# Patient Record
Sex: Female | Born: 1970 | Race: Black or African American | Hispanic: No | Marital: Married | Smoking: Former smoker
Health system: Southern US, Community
[De-identification: ages and names within clinical notes are randomized; demographics above are authoritative.]

## PROBLEM LIST (undated history)

## (undated) DIAGNOSIS — F4310 Post-traumatic stress disorder, unspecified: Secondary | ICD-10-CM

## (undated) DIAGNOSIS — J343 Hypertrophy of nasal turbinates: Secondary | ICD-10-CM

## (undated) DIAGNOSIS — G43909 Migraine, unspecified, not intractable, without status migrainosus: Secondary | ICD-10-CM

## (undated) DIAGNOSIS — Z8489 Family history of other specified conditions: Secondary | ICD-10-CM

## (undated) DIAGNOSIS — R51 Headache: Secondary | ICD-10-CM

## (undated) DIAGNOSIS — R519 Headache, unspecified: Secondary | ICD-10-CM

## (undated) DIAGNOSIS — M797 Fibromyalgia: Secondary | ICD-10-CM

## (undated) DIAGNOSIS — J45909 Unspecified asthma, uncomplicated: Secondary | ICD-10-CM

## (undated) HISTORY — PX: CYST EXCISION: SHX5701

## (undated) HISTORY — PX: MOUTH SURGERY: SHX715

---

## 2010-10-30 HISTORY — PX: ABDOMINAL HYSTERECTOMY: SHX81

## 2015-11-07 ENCOUNTER — Ambulatory Visit (INDEPENDENT_AMBULATORY_CARE_PROVIDER_SITE_OTHER): Payer: PRIVATE HEALTH INSURANCE | Admitting: Otolaryngology

## 2015-11-07 DIAGNOSIS — J320 Chronic maxillary sinusitis: Secondary | ICD-10-CM | POA: Diagnosis not present

## 2015-11-07 DIAGNOSIS — J342 Deviated nasal septum: Secondary | ICD-10-CM | POA: Diagnosis not present

## 2015-11-07 DIAGNOSIS — J343 Hypertrophy of nasal turbinates: Secondary | ICD-10-CM | POA: Diagnosis not present

## 2015-11-07 DIAGNOSIS — J310 Chronic rhinitis: Secondary | ICD-10-CM | POA: Diagnosis not present

## 2015-11-09 ENCOUNTER — Other Ambulatory Visit (INDEPENDENT_AMBULATORY_CARE_PROVIDER_SITE_OTHER): Payer: Self-pay | Admitting: Otolaryngology

## 2015-11-09 DIAGNOSIS — J329 Chronic sinusitis, unspecified: Secondary | ICD-10-CM

## 2015-11-21 ENCOUNTER — Ambulatory Visit (HOSPITAL_COMMUNITY)
Admission: RE | Admit: 2015-11-21 | Discharge: 2015-11-21 | Disposition: A | Discharge status: Home / Self Care | Payer: PRIVATE HEALTH INSURANCE | Source: Ambulatory Visit | Attending: Otolaryngology | Admitting: Otolaryngology

## 2015-11-21 DIAGNOSIS — J0101 Acute recurrent maxillary sinusitis: Secondary | ICD-10-CM | POA: Insufficient documentation

## 2015-11-21 DIAGNOSIS — J329 Chronic sinusitis, unspecified: Secondary | ICD-10-CM

## 2015-11-22 DIAGNOSIS — J343 Hypertrophy of nasal turbinates: Secondary | ICD-10-CM

## 2015-11-22 HISTORY — DX: Hypertrophy of nasal turbinates: J34.3

## 2015-11-28 ENCOUNTER — Ambulatory Visit (INDEPENDENT_AMBULATORY_CARE_PROVIDER_SITE_OTHER): Payer: PRIVATE HEALTH INSURANCE | Admitting: Otolaryngology

## 2015-11-28 DIAGNOSIS — J343 Hypertrophy of nasal turbinates: Secondary | ICD-10-CM | POA: Diagnosis not present

## 2015-11-28 DIAGNOSIS — J310 Chronic rhinitis: Secondary | ICD-10-CM

## 2015-11-29 ENCOUNTER — Other Ambulatory Visit: Payer: Self-pay | Admitting: Otolaryngology

## 2015-12-15 ENCOUNTER — Encounter (HOSPITAL_BASED_OUTPATIENT_CLINIC_OR_DEPARTMENT_OTHER): Payer: Self-pay | Admitting: *Deleted

## 2015-12-20 ENCOUNTER — Ambulatory Visit (HOSPITAL_BASED_OUTPATIENT_CLINIC_OR_DEPARTMENT_OTHER): Payer: PRIVATE HEALTH INSURANCE | Admitting: Anesthesiology

## 2015-12-20 ENCOUNTER — Encounter (HOSPITAL_BASED_OUTPATIENT_CLINIC_OR_DEPARTMENT_OTHER)
Admission: RE | Disposition: A | Discharge status: Home / Self Care | Payer: Self-pay | Source: Ambulatory Visit | Attending: Otolaryngology

## 2015-12-20 ENCOUNTER — Ambulatory Visit (HOSPITAL_BASED_OUTPATIENT_CLINIC_OR_DEPARTMENT_OTHER)
Admission: RE | Admit: 2015-12-20 | Discharge: 2015-12-20 | Disposition: A | Discharge status: Home / Self Care | Payer: PRIVATE HEALTH INSURANCE | Source: Ambulatory Visit | Attending: Otolaryngology | Admitting: Otolaryngology

## 2015-12-20 ENCOUNTER — Encounter (HOSPITAL_BASED_OUTPATIENT_CLINIC_OR_DEPARTMENT_OTHER): Payer: Self-pay

## 2015-12-20 DIAGNOSIS — J3489 Other specified disorders of nose and nasal sinuses: Secondary | ICD-10-CM | POA: Insufficient documentation

## 2015-12-20 DIAGNOSIS — J343 Hypertrophy of nasal turbinates: Secondary | ICD-10-CM | POA: Insufficient documentation

## 2015-12-20 DIAGNOSIS — J45909 Unspecified asthma, uncomplicated: Secondary | ICD-10-CM | POA: Diagnosis not present

## 2015-12-20 DIAGNOSIS — Z79899 Other long term (current) drug therapy: Secondary | ICD-10-CM | POA: Diagnosis not present

## 2015-12-20 HISTORY — DX: Headache: R51

## 2015-12-20 HISTORY — DX: Hypertrophy of nasal turbinates: J34.3

## 2015-12-20 HISTORY — DX: Headache, unspecified: R51.9

## 2015-12-20 HISTORY — PX: TURBINATE REDUCTION: SHX6157

## 2015-12-20 HISTORY — DX: Family history of other specified conditions: Z84.89

## 2015-12-20 HISTORY — DX: Fibromyalgia: M79.7

## 2015-12-20 HISTORY — DX: Migraine, unspecified, not intractable, without status migrainosus: G43.909

## 2015-12-20 SURGERY — REDUCTION, NASAL TURBINATE
Anesthesia: General | Site: Nose | Laterality: Bilateral

## 2015-12-20 MED ORDER — DEXAMETHASONE SODIUM PHOSPHATE 10 MG/ML IJ SOLN
INTRAMUSCULAR | Status: AC
  Filled 2015-12-20: qty 1

## 2015-12-20 MED ORDER — SUCCINYLCHOLINE CHLORIDE 200 MG/10ML IV SOSY
PREFILLED_SYRINGE | INTRAVENOUS | Status: AC
  Filled 2015-12-20: qty 10

## 2015-12-20 MED ORDER — LACTATED RINGERS IV SOLN
INTRAVENOUS | Status: DC
  Administered 2015-12-20 (×2): via INTRAVENOUS

## 2015-12-20 MED ORDER — GLYCOPYRROLATE 0.2 MG/ML IJ SOLN: 0.2000 mg | Freq: Once | INTRAMUSCULAR | Status: DC | PRN

## 2015-12-20 MED ORDER — FENTANYL CITRATE (PF) 100 MCG/2ML IJ SOLN
50.0000 ug | INTRAMUSCULAR | Status: DC | PRN
  Administered 2015-12-20: 100 ug via INTRAVENOUS

## 2015-12-20 MED ORDER — MIDAZOLAM HCL 2 MG/2ML IJ SOLN
1.0000 mg | INTRAMUSCULAR | Status: DC | PRN
  Administered 2015-12-20: 2 mg via INTRAVENOUS

## 2015-12-20 MED ORDER — HYDROMORPHONE HCL 1 MG/ML IJ SOLN
0.2500 mg | INTRAMUSCULAR | Status: DC | PRN
  Administered 2015-12-20 (×2): 0.5 mg via INTRAVENOUS

## 2015-12-20 MED ORDER — SUCCINYLCHOLINE CHLORIDE 20 MG/ML IJ SOLN
INTRAMUSCULAR | Status: DC | PRN
  Administered 2015-12-20: 120 mg via INTRAVENOUS

## 2015-12-20 MED ORDER — ONDANSETRON HCL 4 MG/2ML IJ SOLN: 4.0000 mg | Freq: Once | INTRAMUSCULAR | Status: DC | PRN

## 2015-12-20 MED ORDER — DEXAMETHASONE SODIUM PHOSPHATE 4 MG/ML IJ SOLN
INTRAMUSCULAR | Status: DC | PRN
  Administered 2015-12-20: 10 mg via INTRAVENOUS

## 2015-12-20 MED ORDER — OXYCODONE HCL 5 MG/5ML PO SOLN: 5.0000 mg | Freq: Once | ORAL | Status: DC | PRN

## 2015-12-20 MED ORDER — FENTANYL CITRATE (PF) 100 MCG/2ML IJ SOLN
INTRAMUSCULAR | Status: AC
  Filled 2015-12-20: qty 2

## 2015-12-20 MED ORDER — OXYMETAZOLINE HCL 0.05 % NA SOLN
NASAL | Status: DC | PRN
  Administered 2015-12-20: 1

## 2015-12-20 MED ORDER — HYDROMORPHONE HCL 1 MG/ML IJ SOLN
INTRAMUSCULAR | Status: AC
  Filled 2015-12-20: qty 1

## 2015-12-20 MED ORDER — OXYCODONE HCL 5 MG PO TABS: 5.0000 mg | ORAL_TABLET | Freq: Once | ORAL | Status: DC | PRN

## 2015-12-20 MED ORDER — OXYMETAZOLINE HCL 0.05 % NA SOLN
NASAL | Status: AC
  Filled 2015-12-20: qty 15

## 2015-12-20 MED ORDER — PHENYLEPHRINE 40 MCG/ML (10ML) SYRINGE FOR IV PUSH (FOR BLOOD PRESSURE SUPPORT)
PREFILLED_SYRINGE | INTRAVENOUS | Status: AC
  Filled 2015-12-20: qty 10

## 2015-12-20 MED ORDER — SCOPOLAMINE 1 MG/3DAYS TD PT72: 1.0000 | MEDICATED_PATCH | Freq: Once | TRANSDERMAL | Status: DC | PRN

## 2015-12-20 MED ORDER — ONDANSETRON HCL 4 MG/2ML IJ SOLN
INTRAMUSCULAR | Status: AC
  Filled 2015-12-20: qty 2

## 2015-12-20 MED ORDER — OXYCODONE-ACETAMINOPHEN 5-325 MG PO TABS: 1.0000 | ORAL_TABLET | Freq: Four times a day (QID) | ORAL | 0 refills | Status: DC | PRN

## 2015-12-20 MED ORDER — MEPERIDINE HCL 25 MG/ML IJ SOLN: 6.2500 mg | INTRAMUSCULAR | Status: DC | PRN

## 2015-12-20 MED ORDER — ONDANSETRON HCL 4 MG/2ML IJ SOLN
INTRAMUSCULAR | Status: DC | PRN
  Administered 2015-12-20: 4 mg via INTRAVENOUS

## 2015-12-20 MED ORDER — LIDOCAINE 2% (20 MG/ML) 5 ML SYRINGE
INTRAMUSCULAR | Status: AC
  Filled 2015-12-20: qty 5

## 2015-12-20 MED ORDER — PROPOFOL 10 MG/ML IV BOLUS
INTRAVENOUS | Status: DC | PRN
  Administered 2015-12-20: 200 mg via INTRAVENOUS

## 2015-12-20 MED ORDER — AMOXICILLIN 875 MG PO TABS: 875.0000 mg | ORAL_TABLET | Freq: Two times a day (BID) | ORAL | 0 refills | Status: DC

## 2015-12-20 MED ORDER — LIDOCAINE HCL (CARDIAC) 20 MG/ML IV SOLN
INTRAVENOUS | Status: DC | PRN
  Administered 2015-12-20: 100 mg via INTRAVENOUS

## 2015-12-20 MED ORDER — MIDAZOLAM HCL 2 MG/2ML IJ SOLN
INTRAMUSCULAR | Status: AC
  Filled 2015-12-20: qty 2

## 2015-12-20 SURGICAL SUPPLY — 22 items
ATTRACTOMAT 16X20 MAGNETIC DRP (DRAPES) IMPLANT
CANISTER SUCT 1200ML W/VALVE (MISCELLANEOUS) ×3 IMPLANT
COAGULATOR SUCT 8FR VV (MISCELLANEOUS) IMPLANT
DECANTER SPIKE VIAL GLASS SM (MISCELLANEOUS) IMPLANT
ELECT REM PT RETURN 9FT ADLT (ELECTROSURGICAL) ×3
ELECTRODE REM PT RTRN 9FT ADLT (ELECTROSURGICAL) ×1 IMPLANT
GLOVE BIO SURGEON STRL SZ7.5 (GLOVE) ×3 IMPLANT
GOWN STRL REUS W/ TWL LRG LVL3 (GOWN DISPOSABLE) ×2 IMPLANT
GOWN STRL REUS W/TWL LRG LVL3 (GOWN DISPOSABLE) ×4
NEEDLE HYPO 25X1 1.5 SAFETY (NEEDLE) ×3 IMPLANT
NS IRRIG 1000ML POUR BTL (IV SOLUTION) ×3 IMPLANT
PACK BASIN DAY SURGERY FS (CUSTOM PROCEDURE TRAY) ×3 IMPLANT
PACK ENT DAY SURGERY (CUSTOM PROCEDURE TRAY) ×3 IMPLANT
PACKING NASAL EPIS 4X2.4 XEROG (MISCELLANEOUS) IMPLANT
PATTIES SURGICAL .5 X3 (DISPOSABLE) IMPLANT
SLEEVE SCD COMPRESS KNEE MED (MISCELLANEOUS) IMPLANT
SOLUTION BUTLER CLEAR DIP (MISCELLANEOUS) ×3 IMPLANT
SPONGE GAUZE 2X2 8PLY STER LF (GAUZE/BANDAGES/DRESSINGS) ×1
SPONGE GAUZE 2X2 8PLY STRL LF (GAUZE/BANDAGES/DRESSINGS) ×2 IMPLANT
SPONGE NEURO XRAY DETECT 1X3 (DISPOSABLE) ×3 IMPLANT
TOWEL OR 17X24 6PK STRL BLUE (TOWEL DISPOSABLE) ×3 IMPLANT
YANKAUER SUCT BULB TIP NO VENT (SUCTIONS) IMPLANT

## 2015-12-20 NOTE — Anesthesia Preprocedure Evaluation (Signed)
Anesthesia Evaluation  Patient identified by MRN, date of birth, ID band Patient awake    Reviewed: Allergy & Precautions, NPO status , Patient's Chart, lab work & pertinent test results  Airway Mallampati: I  TM Distance: >3 FB Neck ROM: Full    Dental  (+) Teeth Intact, Dental Advisory Given   Pulmonary asthma ,    breath sounds clear to auscultation       Cardiovascular  Rhythm:Regular Rate:Normal     Neuro/Psych    GI/Hepatic   Endo/Other    Renal/GU      Musculoskeletal   Abdominal   Peds  Hematology   Anesthesia Other Findings   Reproductive/Obstetrics                             Anesthesia Physical Anesthesia Plan  ASA: I  Anesthesia Plan: General   Post-op Pain Management:    Induction: Intravenous  Airway Management Planned: Oral ETT  Additional Equipment:   Intra-op Plan:   Post-operative Plan: Extubation in OR  Informed Consent: I have reviewed the patients History and Physical, chart, labs and discussed the procedure including the risks, benefits and alternatives for the proposed anesthesia with the patient or authorized representative who has indicated his/her understanding and acceptance.   Dental advisory given  Plan Discussed with: CRNA and Anesthesiologist  Anesthesia Plan Comments:         Anesthesia Quick Evaluation

## 2015-12-20 NOTE — Anesthesia Postprocedure Evaluation (Signed)
Anesthesia Post Note  Patient: Diane Cowan  Procedure(s) Performed: Procedure(s) (LRB): TURBINATE REDUCTION (Bilateral)  Patient location during evaluation: PACU Anesthesia Type: General Level of consciousness: awake and alert Pain management: pain level controlled Vital Signs Assessment: post-procedure vital signs reviewed and stable Respiratory status: spontaneous breathing, nonlabored ventilation and respiratory function stable Cardiovascular status: blood pressure returned to baseline and stable Postop Assessment: no signs of nausea or vomiting Anesthetic complications: no    Last Vitals:  Vitals:   12/20/15 1220 12/20/15 1258  BP:  (!) 138/92  Pulse: 87 76  Resp: 16 18  Temp:  36.9 C    Last Pain:  Vitals:   12/20/15 1258  TempSrc:   PainSc: 3                  Diane Cowan

## 2015-12-20 NOTE — Discharge Instructions (Addendum)
POSTOPERATIVE INSTRUCTIONS FOR PATIENTS HAVING NASAL OR SINUS OPERATIONS ACTIVITY: Restrict activity at home for the first two days, resting as much as possible. Light activity is best. You may usually return to work within a week. You should refrain from nose blowing, strenuous activity, or heavy lifting greater than 20lbs for a total of three weeks after your operation.  If sneezing cannot be avoided, sneeze with your mouth open. DISCOMFORT: You may experience a dull headache and pressure along with nasal congestion and discharge. These symptoms may be worse during the first week after the operation but may last as long as two to four weeks.  Please take Tylenol or the pain medication that has been prescribed for you. Do not take aspirin or aspirin containing medications since they may cause bleeding.  You may experience symptoms of post nasal drainage, nasal congestion, headaches and fatigue for two or three months after your operation.  BLEEDING: You may have some blood tinged nasal drainage for approximately two weeks after the operation.  The discharge will be worse for the first week.  Please call our office at [phone] or go to the nearest hospital emergency room if you experience any of the following: heavy, bright red blood from your nose or mouth that lasts longer than ten minutes or coughing up or vomiting bright red blood or blood clots. GENERAL CONSIDERATIONS: 1. A gauze dressing will be placed on your upper lip to absorb any drainage after the operation. You may need to change this several times a day.  If you do not have very much drainage, you may remove the dressing.  Remember that you may gently wipe your nose with a tissue and sniff in, but DO NOT blow your nose. 2. Please keep all of your postoperative appointments.  Your final results after the operation will depend on proper follow-up.  The initial visit is usually four to seven days after the operation.  During this visit, the  remaining nasal packing and internal septal splints will be removed.  Your nasal and sinus cavities will be cleaned.  During the second visit, your nasal and sinus cavities will be cleaned again. Have someone drive you to your first two postoperative appointments. We suggest that you take your prescribed pain medication about  hour prior to each of these two appointments.  3. How you care for your nose after the operation will influence the results that you obtain.  You should follow all directions, take your medication as prescribed, and call our office [phone] with any problems or questions. 4. You may be more comfortable sleeping with your head elevated on two pillows. 5. Do not take any medications that we have not prescribed or recommended. WARNING SIGNS: if any of the following should occur, please call our office: 1. Bright red bleeding which lasts more than 10 minutes. 2. Persistent fever greater than 102F.   Post Anesthesia Home Care Instructions  Activity: Get plenty of rest for the remainder of the day. A responsible adult should stay with you for 24 hours following the procedure.  For the next 24 hours, DO NOT: -Drive a car -Advertising copywriterperate machinery -Drink alcoholic beverages -Take any medication unless instructed by your physician -Make any legal decisions or sign important papers.  Meals: Start with liquid foods such as gelatin or soup. Progress to regular foods as tolerated. Avoid greasy, spicy, heavy foods. If nausea and/or vomiting occur, drink only clear liquids until the nausea and/or vomiting subsides. Call your physician if vomiting continues.  Special Instructions/Symptoms: Your throat may feel dry or sore from the anesthesia or the breathing tube placed in your throat during surgery. If this causes discomfort, gargle with warm salt water. The discomfort should disappear within 24 hours.  If you had a scopolamine patch placed behind your ear for the management of post-  operative nausea and/or vomiting:  1. The medication in the patch is effective for 72 hours, after which it should be removed.  Wrap patch in a tissue and discard in the trash. Wash hands thoroughly with soap and water. 2. You may remove the patch earlier than 72 hours if you experience unpleasant side effects which may include dry mouth, dizziness or visual disturbances. 3. Avoid touching the patch. Wash your hands with soap and water after contact with the patch.    3. Persistent vomiting. 4. Severe and constant pain that is not relieved by prescribed pain medication. 5. Trauma to the nose. 6. Rash or unusual side effects from any medicines.

## 2015-12-20 NOTE — H&P (Signed)
Cc: Chronic nasal obstruction  HPI: The patient is a 45 year old female who returns today for her follow-up evaluation.  She was last seen 3 weeks ago.  At that time, she was noted to have significant nasal obstruction, secondary to nasal mucosal congestion and bilateral inferior turbinate hypertrophy.  She was continued on her Flonase nasal spray and daily Allegra.  She also underwent a sinus CT scan.  The CT showed no evidence of significant sinus disease.  Severe bilateral inferior turbinate hypertrophy was noted.  The patient returns today reporting no improvement in her nasal obstruction. She continues to have significant difficulty breathing through her nostrils.  She is interested in more definitive treatment of her condition.   Exam: The nasal cavities were decongested and anesthetised with a combination of oxymetazoline and 4% lidocaine solution.  The flexible scope was inserted into the right nasal cavity.  Endoscopy of the inferior and middle meatus was performed.  Edematous mucosa was noted.  No polyp, mass, or lesion was appreciated.  Olfactory cleft was clear.  Nasopharynx was clear.  Turbinates were severely hypertrophied but without mass.  Incomplete response to decongestion.  The procedure was repeated on the contralateral side with similar findings.  The patient tolerated the procedure well.  Instructions were given to avoid eating or drinking for 2 hours.    Assessment: 1.  Persistent chronic nasal obstruction, secondary to her bilateral inferior turbinate hypertrophy and nasal mucosal edema.  2.  She has not responded to conservative medical treatment with allergy medications and systemic/topical steroids.    Plan: 1.  The nasal endoscopy findings and the CT images are reviewed with the patient.  2.  Based on her persistent symptoms, she may benefit from undergoing bilateral turbinate reduction procedure.  The risks, benefits, alternatives and details of the procedure are reviewed.   3.  The patient would like to proceed with the procedure.

## 2015-12-20 NOTE — Anesthesia Procedure Notes (Signed)
Procedure Name: Intubation Date/Time: 12/20/2015 10:43 AM Performed by: Caren MacadamARTER, Karlye Ihrig W Pre-anesthesia Checklist: Patient identified, Emergency Drugs available, Suction available and Patient being monitored Patient Re-evaluated:Patient Re-evaluated prior to inductionOxygen Delivery Method: Circle system utilized Preoxygenation: Pre-oxygenation with 100% oxygen Intubation Type: IV induction Ventilation: Mask ventilation without difficulty Laryngoscope Size: Miller and 2 Grade View: Grade I Tube type: Oral Tube size: 7.0 mm Number of attempts: 1 Airway Equipment and Method: Stylet and Oral airway Placement Confirmation: ETT inserted through vocal cords under direct vision,  positive ETCO2 and breath sounds checked- equal and bilateral Secured at: 21 cm Tube secured with: Tape Dental Injury: Teeth and Oropharynx as per pre-operative assessment

## 2015-12-20 NOTE — Op Note (Signed)
DATE OF PROCEDURE: 12/20/2015  OPERATIVE REPORT   SURGEON: Newman PiesSu Thyra Yinger, MD   PREOPERATIVE DIAGNOSES:  1. Chronic nasal obstruction.  2. Bilateral inferior turbinate hypertrophy.   POSTOPERATIVE DIAGNOSES:  1. Chronic nasal obstruction.  2. Bilateral inferior turbinate hypertrophy.   PROCEDURE PERFORMED: Bilateral partial inferior turbinate resection.   ANESTHESIA: General endotracheal tube anesthesia.   COMPLICATIONS: None.   ESTIMATED BLOOD LOSS: Minimal.   INDICATION FOR PROCEDURE :Diane Cowan is a 45 y.o. female with a history of chronic nasal obstruction. The patient was treated with antihistamine, decongestant, steroid nasal spray.[name]. However, the patient continues to be symptomatic. On examination, the patient was noted to have bilateral severe inferior turbinate hypertrophy, causing significant nasal obstruction. Based on the above findings, the decision was made for the patient to undergo the above-stated procedure. The risks, benefits, alternatives, and details of the procedure were discussed with the patient. Questions were invited and answered. Informed consent was obtained.   DESCRIPTION: The patient was taken to the operating room and placed supine on the operating table. General endotracheal tube anesthesia was administered by the anesthesiologist. The patient was positioned and prepped and draped in a standard fashion for nasal surgery. Pledgets soaked with Afrin were placed in both nasal cavities. The pledgets were subsequently removed. Examination of the nasal cavities revealed bilateral severe inferior turbinate hypertrophy. The inferior one-half of each inferior turbinate was then crossclamped with a straight Kelly clamp. The inferior one-half of each inferior turbinate was then resected with a pair of cross cutting scissors. Hemostasis was achieved with suction electrocautery, under direct visual guidance of the zero-degree endoscope. Good hemostasis was achieved. The care of  the patient was turned over to the anesthesiologist. The patient was awakened from anesthesia without difficulty. The patient was extubated and transferred to the recovery room in good condition.   OPERATIVE FINDINGS: Bilateral inferior turbinate hypertrophy.   SPECIMEN: None.   FOLLOWUP CARE: The patient will be discharged home once she is awake and alert. The patient will be placed on percocet 1-2 tablets p.o. q.6 h. p.r.n. pain, and amoxicillin 875 mg p.o. b.i.d. for 5 days. The patient will follow up in my office in approximately 1 week.   Newman PiesSu Launa Goedken, MD

## 2015-12-20 NOTE — Transfer of Care (Signed)
Immediate Anesthesia Transfer of Care Note  Patient: Diane Cowan  Procedure(s) Performed: Procedure(s): TURBINATE REDUCTION (Bilateral)  Patient Location: PACU  Anesthesia Type:General  Level of Consciousness: awake and alert   Airway & Oxygen Therapy: Patient Spontanous Breathing and Patient connected to face mask oxygen  Post-op Assessment: Report given to RN and Post -op Vital signs reviewed and stable  Post vital signs: Reviewed and stable  Last Vitals:  Vitals:   12/20/15 0855  BP: 128/84  Pulse: 77  Resp: 20  Temp: 36.9 C    Last Pain:  Vitals:   12/20/15 0855  TempSrc: Oral         Complications: No apparent anesthesia complications

## 2015-12-21 ENCOUNTER — Encounter (HOSPITAL_BASED_OUTPATIENT_CLINIC_OR_DEPARTMENT_OTHER): Payer: Self-pay | Admitting: Otolaryngology

## 2015-12-29 ENCOUNTER — Ambulatory Visit (INDEPENDENT_AMBULATORY_CARE_PROVIDER_SITE_OTHER): Payer: PRIVATE HEALTH INSURANCE | Admitting: Otolaryngology

## 2016-01-12 ENCOUNTER — Ambulatory Visit (INDEPENDENT_AMBULATORY_CARE_PROVIDER_SITE_OTHER): Payer: PRIVATE HEALTH INSURANCE | Admitting: Otolaryngology

## 2016-10-11 IMAGING — CT CT MAXILLOFACIAL W/O CM
3 series · 14 of 47 positions shown, 16 images · non-contrast
Comparison: None.

CLINICAL DATA: Chronic sinusitis, 3-4 months duration. Frontal and
maxillary sinus pain and congestion.

EXAM:
CT MAXILLOFACIAL WITHOUT CONTRAST
TECHNIQUE: Multidetector CT imaging of the maxillofacial structures was
performed. Multiplanar CT image reconstructions were also generated.
A small metallic BB was placed on the right temple in order to
reliably differentiate right from left.

[Series 2: axial standard · axial · 0.29mm/px · z∈[+1268,+1356]mm · 8 of 104 slices shown, 10 images]
[im 8/104  brain]
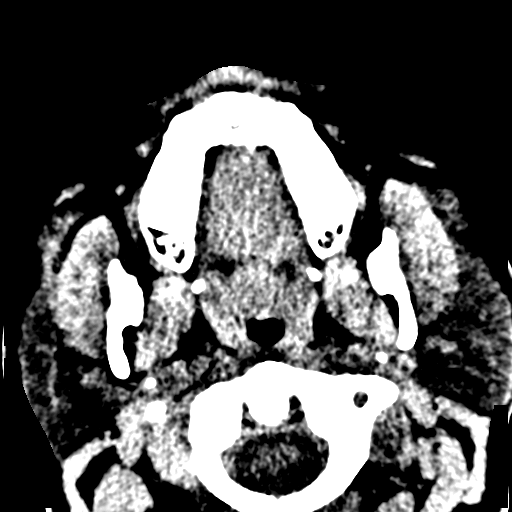
[im 8/104  bone]
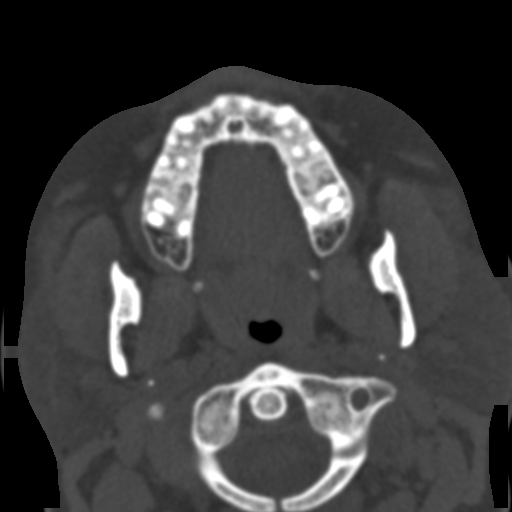
[im 22/104  bone]
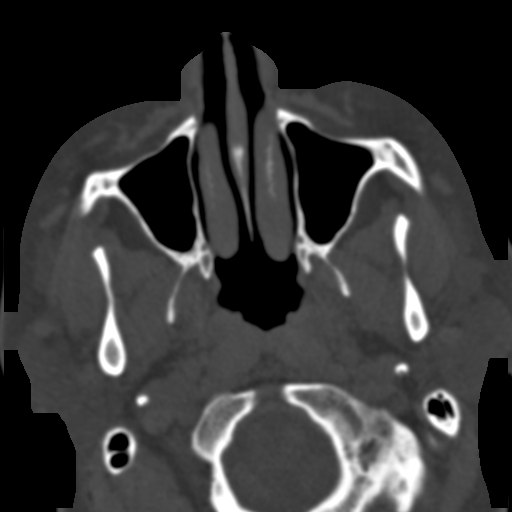
[im 32/104  bone]
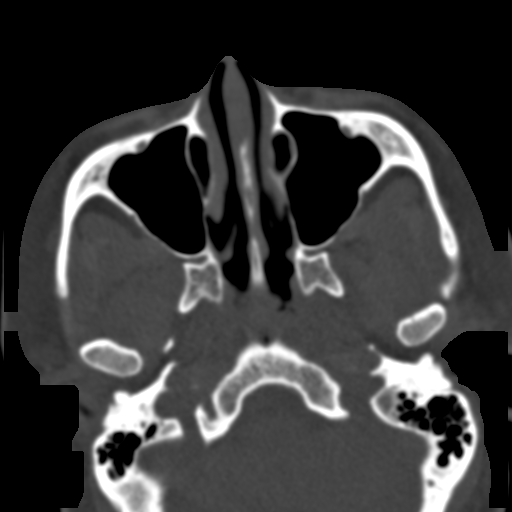
[im 47/104  bone]
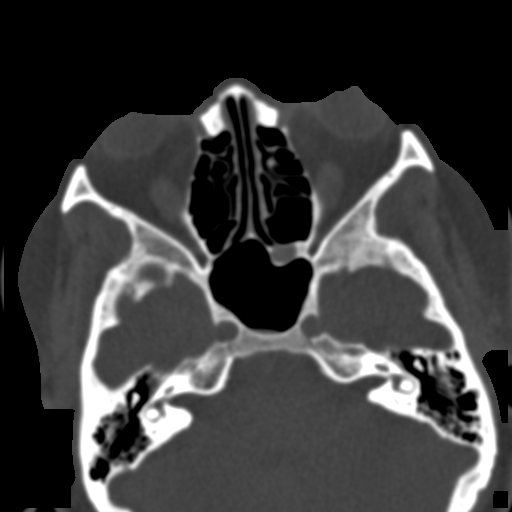
[im 57/104  brain]
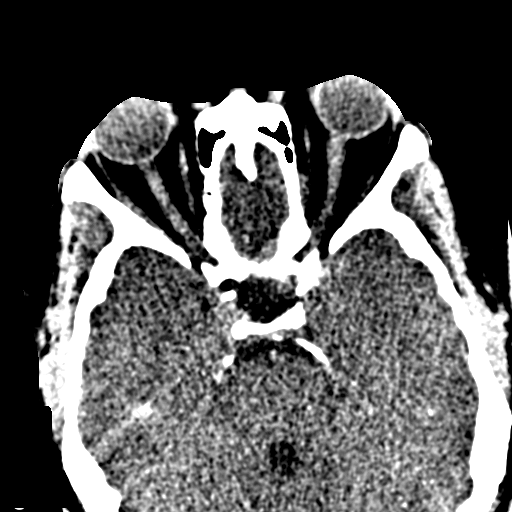
[im 57/104  bone]
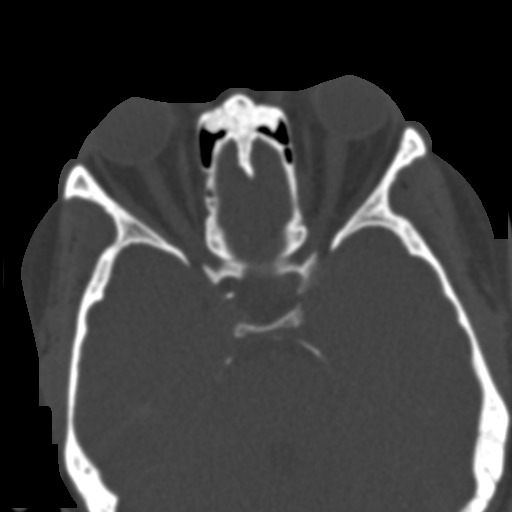
[im 72/104  bone]
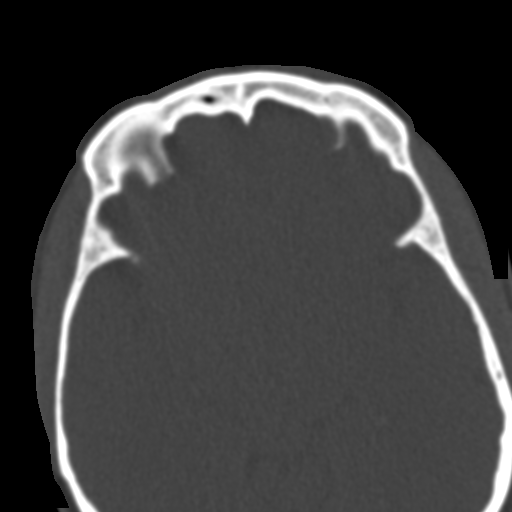
[im 82/104  bone]
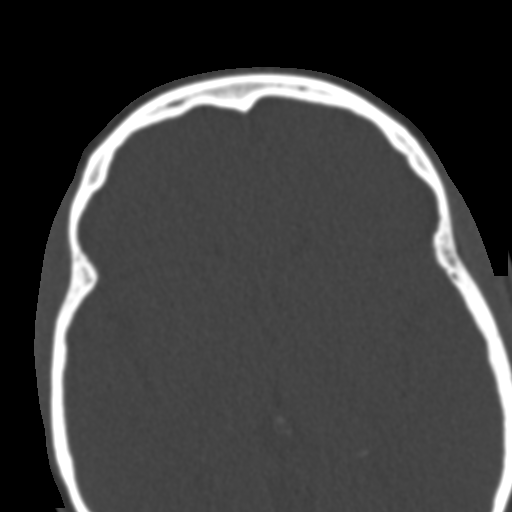
[im 96/104  bone]
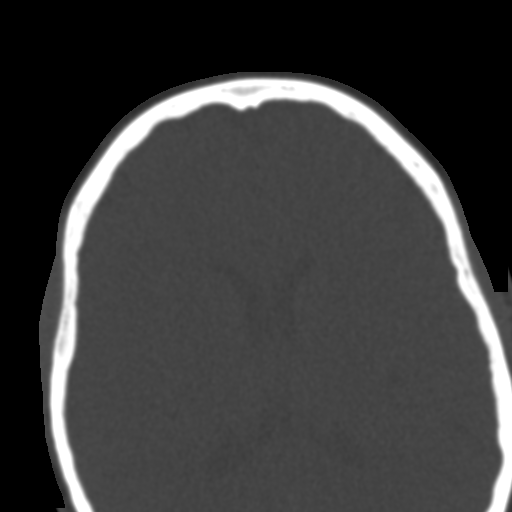

[Series 4: coronal sinus · coronal · 0.23mm/px · 3 of 70 slices shown]
[im 24/70  bone]
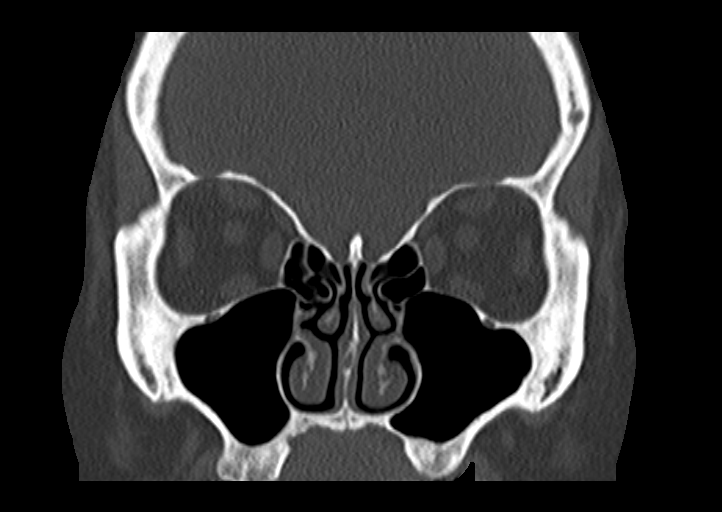
[im 31/70  bone]
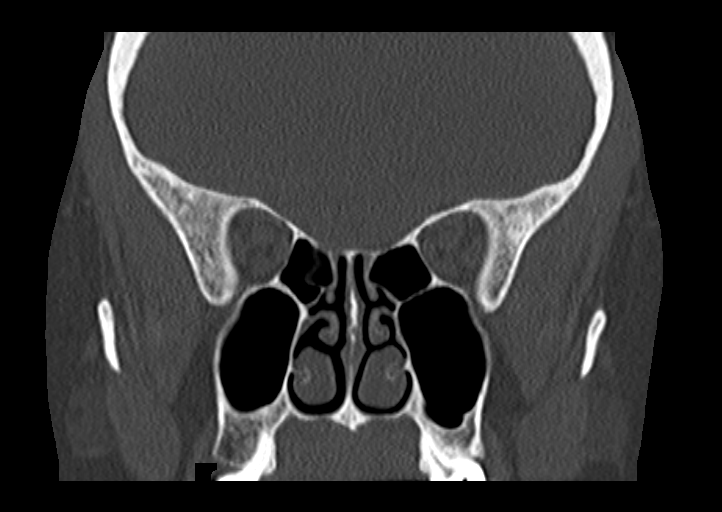
[im 39/70  bone]
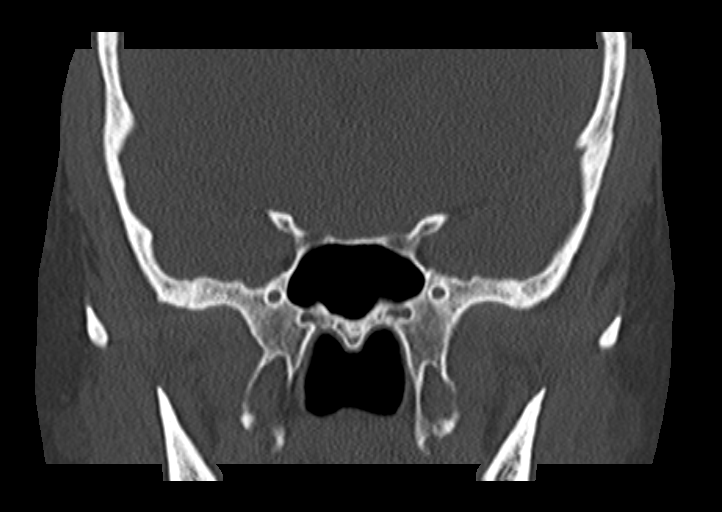

[Series 5: sagittal sinus · sagittal · 0.22mm/px · 3 of 80 slices shown]
[im 27/80  bone]
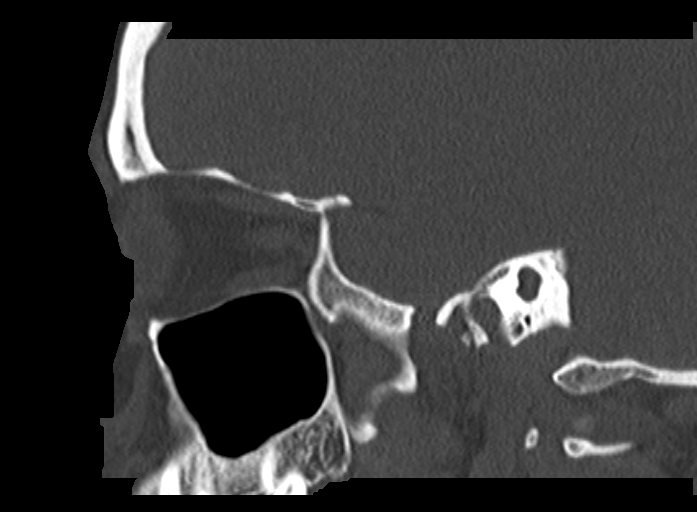
[im 40/80  bone]
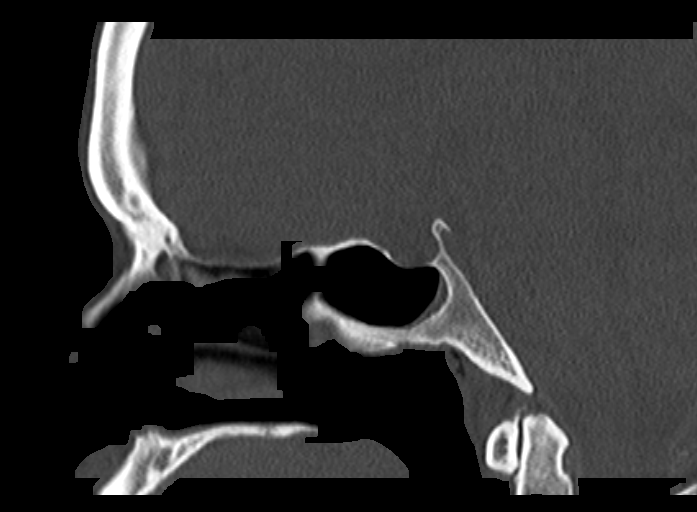
[im 53/80  bone]
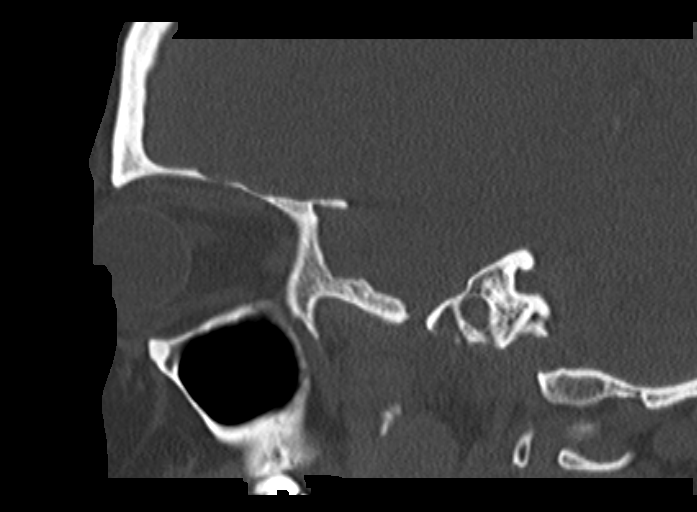

[14 of 47 positions shown; findings below may reference images not displayed]

FINDINGS: Right frontal sinus is clear.  Left frontal sinus is aplastic.

Maxillary sinuses are normal without mucosal thickening, fluid,
polyp, cyst or mass.

Ethmoid sinuses are clear except for a single opacified posterior
ethmoid air cell on the left, probably not significant.

Small amount of mucosal thickening along the posterior wall of the
sphenoid sinus, probably not significant.

No fluid in the middle ears or mastoids.

No significant anatomic variant.
IMPRESSION: No evidence of significant sinus disease. Single opacified posterior
ethmoid air cell on the left. Minimal mucosal thickening along the
posterior wall of the sphenoid sinus.

## 2019-08-19 ENCOUNTER — Encounter: Payer: Self-pay | Admitting: Gastroenterology

## 2019-09-22 ENCOUNTER — Ambulatory Visit (INDEPENDENT_AMBULATORY_CARE_PROVIDER_SITE_OTHER): Payer: PRIVATE HEALTH INSURANCE | Admitting: Gastroenterology

## 2019-09-22 ENCOUNTER — Other Ambulatory Visit: Payer: Self-pay

## 2019-09-22 ENCOUNTER — Encounter: Payer: Self-pay | Admitting: Gastroenterology

## 2019-09-22 DIAGNOSIS — Z8371 Family history of colonic polyps: Secondary | ICD-10-CM

## 2019-09-22 DIAGNOSIS — Z1211 Encounter for screening for malignant neoplasm of colon: Secondary | ICD-10-CM

## 2019-09-22 NOTE — Progress Notes (Signed)
Primary Care Physician:  Marylee Floras, FNP  Primary Gastroenterologist:  Garfield Cornea, MD   Chief Complaint  Patient presents with  . Colonoscopy    never had tcs    HPI:  Diane Cowan is a 49 y.o. female here for consideration of colonoscopy at the request of Eulis Manly, NP.  Patient has never had a colonoscopy.  Her mother had a history of colon polyps.  No family history of colon cancer.  Patient states she has a bowel movement at least twice per day.  Generally if she has any issues going to the bathroom she just changes her diet which seems to help.  No melena, brbpr. Some hemorrhoid issues at times, itching/burning. Uses hemorrhoid wipes. No abdominal pain.  No unintentional weight loss.  No heartburn. No vomiting.   Current Outpatient Medications  Medication Sig Dispense Refill  . albuterol (PROVENTIL HFA;VENTOLIN HFA) 108 (90 Base) MCG/ACT inhaler Inhale into the lungs every 6 (six) hours as needed for wheezing or shortness of breath.    . budesonide-formoterol (SYMBICORT) 80-4.5 MCG/ACT inhaler Inhale 1 puff into the lungs 2 (two) times daily.    . Cholecalciferol (VITAMIN D) 50 MCG (2000 UT) tablet Take 2,000 Units by mouth daily.    [name] Fexofenadine HCl (ALLEGRA PO) Take by mouth daily.    [name] gabapentin (NEURONTIN) 300 MG capsule Take 300 mg by mouth 3 (three) times daily.    . montelukast (SINGULAIR) 10 MG tablet Take 1 tablet by mouth daily.    . Multiple Vitamin (MULTIVITAMIN) tablet Take 1 tablet by mouth daily.    . sertraline (ZOLOFT) 100 MG tablet Take 100 mg by mouth daily.     No current facility-administered medications for this visit.    Allergies as of 09/22/2019 - Review Complete 09/22/2019  Allergen Reaction Noted  . Adhesive [tape] Other (See Comments) 12/15/2015  . Soap Other (See Comments) 12/15/2015  . Tomato Itching and Rash 12/15/2015    Past Medical History:  Diagnosis Date  . Family history of adverse reaction to anesthesia    pt's mother  and sister have hx. of post-op nausea  . Fibromyalgia   . Migraines   . 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 Sinus headache     Past Surgical History:  Procedure Laterality Date  . ABDOMINAL HYSTERECTOMY  10/30/2010   partial  . CYST EXCISION Right    wrist  . MOUTH SURGERY     wisdom teeth  . TURBINATE REDUCTION Bilateral 12/20/2015   Procedure: TURBINATE REDUCTION;  Surgeon: Leta Baptist, MD;  Location: [facility];  Service: ENT;  Laterality: Bilateral;    Family History  Problem Relation Age of Onset  . Anesthesia problems Mother        post-op nausea  . Colon polyps Mother   . Anesthesia problems Sister        post-op nausea  . Colon cancer Neg Hx     Social History   Socioeconomic History  . Marital status: Married    Spouse name: Not on file  . Number of children: Not on file  . Years of education: Not on file  . Highest education level: Not on file  Occupational History  . Not on file  Tobacco Use  . Smoking status: Never Smoker  . Smokeless tobacco: Never Used  Substance and Sexual Activity  . Alcohol use: No  . Drug use: No  . Sexual activity: Not on file  Other Topics Concern  . Not  on file  Social History Narrative  . Not on file   Social Determinants of Health   Financial Resource Strain:   . Difficulty of Paying Living Expenses:   Food Insecurity:   . Worried About Charity fundraiser in the Last Year:   . Arboriculturist in the Last Year:   Transportation Needs:   . Film/video editor (Medical):   [name] Lack of Transportation (Non-Medical):   Physical Activity:   . Days of Exercise per Week:   . Minutes of Exercise per Session:   Stress:   . Feeling of Stress :   Social Connections:   . Frequency of Communication with Friends and Family:   . Frequency of Social Gatherings with Friends and Family:   . Attends Religious Services:   . Active Member of Clubs or Organizations:   . Attends Archivist Meetings:   [name]  Marital Status:   Intimate Partner Violence:   . Fear of Current or Ex-Partner:   . Emotionally Abused:   [name] Physically Abused:   . Sexually Abused:       ROS:  General: Negative for anorexia, weight loss, fever, chills, fatigue, weakness. Eyes: Negative for vision changes.  ENT: Negative for hoarseness, difficulty swallowing , nasal congestion. CV: Negative for chest pain, angina, palpitations, dyspnea on exertion, peripheral edema.  Respiratory: Negative for dyspnea at rest, dyspnea on exertion, cough, sputum, wheezing.  GI: See history of present illness. GU:  Negative for dysuria, hematuria, urinary incontinence, urinary frequency, nocturnal urination.  MS: Negative for joint pain, low back pain.  Derm: Negative for rash or itching.  Neuro: Negative for weakness, abnormal sensation, seizure, frequent headaches, memory loss, confusion.  Psych: Negative for anxiety, depression, suicidal ideation, hallucinations.  Endo: Negative for unusual weight change.  Heme: Negative for bruising or bleeding. Allergy: Negative for rash or hives.    Physical Examination:  BP 129/83   Pulse 84   Temp (!) 97.3 F (36.3 C) (Temporal)   Ht 5' 3" (1.6 m)   Wt 211 lb (95.7 kg)   BMI 37.38 kg/m    General: Well-nourished, well-developed in no acute distress.  Head: Normocephalic, atraumatic.   Eyes: Conjunctiva pink, no icterus. Mouth: masked Neck: Supple without thyromegaly, masses, or lymphadenopathy.  Lungs: Clear to auscultation bilaterally.  Heart: Regular rate and rhythm, no murmurs rubs or gallops.  Abdomen: Bowel sounds are normal, nontender, nondistended, no hepatosplenomegaly or masses, no abdominal bruits or    hernia , no rebound or guarding.   Rectal: Not performed Extremities: No lower extremity edema. No clubbing or deformities.  Neuro: Alert and oriented x 4 , grossly normal neurologically.  Skin: Warm and dry, no rash or jaundice.   Psych: Alert and cooperative, normal  mood and affect.  Labs: Labs dated 07/21/2019: White blood cell count 7600, hemoglobin 12.3, hematocrit 37.6, platelets 332,000, creatinine 0.75, BUN 8, albumin 4.4, total bilirubin 0.4, alk phos 96, AST 13, ALT 13.  Imaging Studies: No results found.

## 2019-09-22 NOTE — Assessment & Plan Note (Signed)
49 year old female presenting for first ever screening colonoscopy.  Mother's had colon polyps removed further details unavailable.  No family history of colon cancer.  I have discussed the risks, alternatives, benefits with regards to but not limited to the risk of reaction to medication, bleeding, infection, perforation and the patient is agreeable to proceed. Written consent to be obtained.

## 2019-09-22 NOTE — Patient Instructions (Signed)
1. Colonoscopy as scheduled. Please see separate instructions. 

## 2019-11-02 ENCOUNTER — Telehealth: Payer: Self-pay | Admitting: *Deleted

## 2019-11-02 ENCOUNTER — Other Ambulatory Visit: Payer: Self-pay | Admitting: *Deleted

## 2019-11-02 MED ORDER — PEG 3350-KCL-NA BICARB-NACL 420 G PO SOLR: 4000.0000 mL | Freq: Once | ORAL | 0 refills | Status: AC

## 2019-11-02 NOTE — Telephone Encounter (Signed)
Lmom for pt to call us back.  Need to schedule procedure. 

## 2019-11-02 NOTE — Telephone Encounter (Signed)
Pt called back and scheduled procedure for 12/04/2019 at 9:45 with Dr. Abbey Chatters.  She was made aware to arrive at 8:15 AM to register.  Pt informed that her Covid screening is scheduled for 12/02/2019 at 3:30.  She is aware to remain in quarantine after Covid screening.  Pt requested work note.  Pt informed that I will send her RX for prep kit to [facility] in [place].  She is aware that I am mailing out prep instructions, work note, and Covid screening info.  Pt voiced understanding.

## 2019-11-03 ENCOUNTER — Encounter: Payer: Self-pay | Admitting: *Deleted

## 2019-11-03 ENCOUNTER — Telehealth: Payer: Self-pay | Admitting: *Deleted

## 2019-11-03 ENCOUNTER — Other Ambulatory Visit: Payer: Self-pay | Admitting: *Deleted

## 2019-11-03 NOTE — Telephone Encounter (Signed)
Called to see if [facility] required auth.  Spoke with Lurena Joiner and Berkley Harvey has been approved for 12/04/2019 procedure.  Good for date of service only.  Auth #: [id number].  Called [employer] and spoke to [place] and no [place] required.  Ref#: [id number].

## 2019-11-18 ENCOUNTER — Encounter (HOSPITAL_COMMUNITY): Payer: Self-pay

## 2019-11-19 ENCOUNTER — Telehealth: Payer: Self-pay

## 2019-11-19 NOTE — Telephone Encounter (Signed)
Pt called and said her Prep for her TCS is on backorder.   Pts pharmacy is [facility].

## 2019-11-20 ENCOUNTER — Other Ambulatory Visit: Payer: Self-pay

## 2019-11-20 ENCOUNTER — Other Ambulatory Visit: Payer: Self-pay | Admitting: *Deleted

## 2019-11-20 ENCOUNTER — Encounter (HOSPITAL_COMMUNITY)
Admission: RE | Admit: 2019-11-20 | Discharge: 2019-11-20 | Disposition: A | Discharge status: Home / Self Care | Payer: PRIVATE HEALTH INSURANCE | Source: Ambulatory Visit | Attending: Internal Medicine | Admitting: Internal Medicine

## 2019-11-20 ENCOUNTER — Encounter (HOSPITAL_COMMUNITY): Payer: Self-pay

## 2019-11-20 HISTORY — DX: Unspecified asthma, uncomplicated: J45.909

## 2019-11-20 HISTORY — DX: Post-traumatic stress disorder, unspecified: F43.10

## 2019-11-20 MED ORDER — PEG 3350-KCL-NA BICARB-NACL 420 G PO SOLR: 4000.0000 mL | Freq: Once | ORAL | 0 refills | Status: AC

## 2019-11-20 NOTE — Telephone Encounter (Signed)
Called pt and she requested me to send RX to [facility].  Sent RX electronically.  Both pt and pharmacy made aware.

## 2019-12-02 ENCOUNTER — Other Ambulatory Visit: Payer: Self-pay

## 2019-12-02 ENCOUNTER — Other Ambulatory Visit (HOSPITAL_COMMUNITY)
Admission: RE | Admit: 2019-12-02 | Discharge: 2019-12-02 | Disposition: A | Discharge status: Home / Self Care | Payer: PRIVATE HEALTH INSURANCE | Source: Ambulatory Visit | Attending: Internal Medicine | Admitting: Internal Medicine

## 2019-12-02 DIAGNOSIS — Z20822 Contact with and (suspected) exposure to covid-19: Secondary | ICD-10-CM | POA: Diagnosis not present

## 2019-12-02 DIAGNOSIS — Z01812 Encounter for preprocedural laboratory examination: Secondary | ICD-10-CM | POA: Insufficient documentation

## 2019-12-03 LAB — SARS CORONAVIRUS 2 (TAT 6-24 HRS): SARS Coronavirus 2: NEGATIVE

## 2019-12-04 ENCOUNTER — Encounter (HOSPITAL_COMMUNITY): Payer: Self-pay

## 2019-12-04 ENCOUNTER — Ambulatory Visit (HOSPITAL_COMMUNITY): Payer: PRIVATE HEALTH INSURANCE | Admitting: Anesthesiology

## 2019-12-04 ENCOUNTER — Ambulatory Visit (HOSPITAL_COMMUNITY)
Admission: RE | Admit: 2019-12-04 | Discharge: 2019-12-04 | Disposition: A | Discharge status: Home / Self Care | Payer: PRIVATE HEALTH INSURANCE | Attending: Internal Medicine | Admitting: Internal Medicine

## 2019-12-04 ENCOUNTER — Other Ambulatory Visit: Payer: Self-pay

## 2019-12-04 ENCOUNTER — Encounter (HOSPITAL_COMMUNITY)
Admission: RE | Disposition: A | Discharge status: Home / Self Care | Payer: Self-pay | Source: Home / Self Care | Attending: Internal Medicine

## 2019-12-04 DIAGNOSIS — K635 Polyp of colon: Secondary | ICD-10-CM

## 2019-12-04 DIAGNOSIS — Z7951 Long term (current) use of inhaled steroids: Secondary | ICD-10-CM | POA: Diagnosis not present

## 2019-12-04 DIAGNOSIS — Z6837 Body mass index (BMI) 37.0-37.9, adult: Secondary | ICD-10-CM | POA: Insufficient documentation

## 2019-12-04 DIAGNOSIS — J45909 Unspecified asthma, uncomplicated: Secondary | ICD-10-CM | POA: Diagnosis not present

## 2019-12-04 DIAGNOSIS — Z87891 Personal history of nicotine dependence: Secondary | ICD-10-CM | POA: Diagnosis not present

## 2019-12-04 DIAGNOSIS — K648 Other hemorrhoids: Secondary | ICD-10-CM | POA: Diagnosis not present

## 2019-12-04 DIAGNOSIS — M797 Fibromyalgia: Secondary | ICD-10-CM | POA: Insufficient documentation

## 2019-12-04 DIAGNOSIS — Z79899 Other long term (current) drug therapy: Secondary | ICD-10-CM | POA: Diagnosis not present

## 2019-12-04 DIAGNOSIS — Z1211 Encounter for screening for malignant neoplasm of colon: Secondary | ICD-10-CM | POA: Diagnosis not present

## 2019-12-04 DIAGNOSIS — F4310 Post-traumatic stress disorder, unspecified: Secondary | ICD-10-CM | POA: Insufficient documentation

## 2019-12-04 HISTORY — PX: COLONOSCOPY WITH PROPOFOL: SHX5780

## 2019-12-04 HISTORY — PX: BIOPSY: SHX5522

## 2019-12-04 SURGERY — COLONOSCOPY WITH PROPOFOL
Anesthesia: General

## 2019-12-04 MED ORDER — LACTATED RINGERS IV SOLN: INTRAVENOUS | Status: DC

## 2019-12-04 MED ORDER — PROPOFOL 10 MG/ML IV BOLUS
INTRAVENOUS | Status: DC | PRN
  Administered 2019-12-04: 120 mg via INTRAVENOUS
  Administered 2019-12-04: 175 ug/kg/min via INTRAVENOUS
  Administered 2019-12-04: 70 mg via INTRAVENOUS

## 2019-12-04 MED ORDER — CHLORHEXIDINE GLUCONATE CLOTH 2 % EX PADS: 6.0000 | MEDICATED_PAD | Freq: Once | CUTANEOUS | Status: DC

## 2019-12-04 NOTE — Discharge Instructions (Addendum)
Colonoscopy Discharge Instructions  Read the instructions outlined below and refer to this sheet in the next few weeks. These discharge instructions provide you with general information on caring for yourself after you leave the hospital. Your doctor may also give you specific instructions. While your treatment has been planned according to the most current medical practices available, unavoidable complications occasionally occur.  ACTIVITY  You may resume your regular activity, but move at a slower pace for the next 24 hours.   Take frequent rest periods for the next 24 hours.   Walking will help get rid of the air and reduce the bloated feeling in your belly (abdomen).   No driving for 24 hours (because of the medicine (anesthesia) used during the test).    Do not sign any important legal documents or operate any machinery for 24 hours (because of the anesthesia used during the test).  NUTRITION  Drink plenty of fluids.   You may resume your normal diet as instructed by your doctor.   Begin with a light meal and progress to your normal diet. Heavy or fried foods are harder to digest and may make you feel sick to your stomach (nauseated).   Avoid alcoholic beverages for 24 hours or as instructed.  MEDICATIONS  You may resume your normal medications unless your doctor tells you otherwise.  WHAT YOU CAN EXPECT TODAY  Some feelings of bloating in the abdomen.   Passage of more gas than usual.   Spotting of blood in your stool or on the toilet paper.  IF YOU HAD POLYPS REMOVED DURING THE COLONOSCOPY:  No aspirin products for 7 days or as instructed.   No alcohol for 7 days or as instructed.   Eat a soft diet for the next 24 hours.  FINDING OUT THE RESULTS OF YOUR TEST Not all test results are available during your visit. If your test results are not back during the visit, make an appointment with your caregiver to find out the results. Do not assume everything is normal if  you have not heard from your caregiver or the medical facility. It is important for you to follow up on all of your test results.  SEEK IMMEDIATE MEDICAL ATTENTION IF:  You have more than a spotting of blood in your stool.   Your belly is swollen (abdominal distention).   You are nauseated or vomiting.   You have a temperature over 101.   You have abdominal pain or discomfort that is severe or gets worse throughout the day.   Colon Polyps  Polyps are tissue growths inside the body. Polyps can grow in many places, including the large intestine (colon). A polyp may be a round bump or a mushroom-shaped growth. You could have one polyp or several. Most colon polyps are noncancerous (benign). However, some colon polyps can become cancerous over time. Finding and removing the polyps early can help prevent this. What are the causes? The exact cause of colon polyps is not known. What increases the risk? You are more likely to develop this condition if you:  Have a family history of colon cancer or colon polyps.  Are older than 47 or older than 45 if you are African American.  Have inflammatory bowel disease, such as ulcerative colitis or Crohn's disease.  Have certain hereditary conditions, such as: ? Familial adenomatous polyposis. ? Lynch syndrome. ? Turcot syndrome. ? Peutz-Jeghers syndrome.  Are overweight.  Smoke cigarettes.  Do not get enough exercise.  Drink too much  alcohol.  Eat a diet that is high in fat and red meat and low in fiber.  Had childhood cancer that was treated with abdominal radiation. What are the signs or symptoms? Most polyps do not cause symptoms. If you have symptoms, they may include:  Blood coming from your rectum when having a bowel movement.  Blood in your stool. The stool may look dark red or black.  Abdominal pain.  A change in bowel habits, such as constipation or diarrhea. How is this diagnosed? This condition is diagnosed with a  colonoscopy. This is a procedure in which a lighted, flexible scope is inserted into the anus and then passed into the colon to examine the area. Polyps are sometimes found when a colonoscopy is done as part of routine cancer screening tests. How is this treated? Treatment for this condition involves removing any polyps that are found. Most polyps can be removed during a colonoscopy. Those polyps will then be tested for cancer. Additional treatment may be needed depending on the results of testing. Follow these instructions at home: Lifestyle  Maintain a healthy weight, or lose weight if recommended by your health care provider.  Exercise every day or as told by your health care provider.  Do not use any products that contain nicotine or tobacco, such as cigarettes and e-cigarettes. If you need help quitting, ask your health care provider.  If you drink alcohol, limit how much you have: ? 0-1 drink a day for women. ? 0-2 drinks a day for men.  Be aware of how much alcohol is in your drink. In the [place] one drink equals one 12 oz bottle of beer (355 mL), one 5 oz glass of wine (148 mL), or one 1 oz shot of hard liquor (44 mL). Eating and drinking   Eat foods that are high in fiber, such as fruits, vegetables, and whole grains.  Eat foods that are high in calcium and vitamin D, such as milk, cheese, yogurt, eggs, liver, fish, and broccoli.  Limit foods that are high in fat, such as fried foods and desserts.  Limit the amount of red meat and processed meat you eat, such as hot dogs, sausage, bacon, and lunch meats. General instructions  Keep all follow-up visits as told by your health care provider. This is important. ? This includes having regularly scheduled colonoscopies. ? Talk to your health care provider about when you need a colonoscopy. Contact a health care provider if:  You have new or worsening bleeding during a bowel movement.  You have new or increased blood in your  stool.  You have a change in bowel habits.  You lose weight for no known reason. Summary  Polyps are tissue growths inside the body. Polyps can grow in many places, including the colon.  Most colon polyps are noncancerous (benign), but some can become cancerous over time.  This condition is diagnosed with a colonoscopy.  Treatment for this condition involves removing any polyps that are found. Most polyps can be removed during a colonoscopy. This information is not intended to replace advice given to you by your health care provider. Make sure you discuss any questions you have with your health care provider. Document Revised: 07/25/2017 Document Reviewed: 07/25/2017 Elsevier Patient Education  2020 [employer].   2 small polyps removed. Repeat colonoscopy in 7-10 years depending on pathology. We will reach out to you next week when resulted. Follow up with GI as needed.  Hope you have a great weekend!

## 2019-12-04 NOTE — Op Note (Signed)
[facility] Patient Name: Diane Cowan Procedure Date: 12/04/2019 8:57 AM MRN: [id number] Date of Birth: 1971-01-10 Attending MD: Elon Alas. Edgar Frisk CSN: [id number] Age: 49 Admit Type: Outpatient Procedure:                Colonoscopy Indications:              Screening for colorectal malignant neoplasm Providers:                Elon Alas. Abbey Chatters, DO, Janeece Riggers, RN, Lambert Mody, Aram Candela Referring MD:              Medicines:                See the Anesthesia note for documentation of the                            administered medications Complications:            No immediate complications. Estimated Blood Loss:     Estimated blood loss was minimal. Procedure:                Pre-Anesthesia Assessment:                           - The anesthesia plan was to use monitored                            anesthesia care (MAC).                           After obtaining informed consent, the colonoscope                            was passed under direct vision. Throughout the                            procedure, the patient's blood pressure, pulse, and                            oxygen saturations were monitored continuously. The                            [id number]-[id number] ([id number]) scope was introduced through                            the anus and advanced to the the cecum, identified                            by appendiceal orifice and ileocecal valve. The                            colonoscopy was performed without difficulty. The                            patient tolerated the procedure well.  The quality                            of the bowel preparation was evaluated using the                            BBPS [facility] Bowel Preparation Scale) with scores                            of: Right Colon = 2 (minor amount of residual                            staining, small fragments of stool and/or opaque                            liquid, but mucosa  seen well), Transverse Colon = 3                            (entire mucosa seen well with no residual staining,                            small fragments of stool or opaque liquid) and Left                            Colon = 3 (entire mucosa seen well with no residual                            staining, small fragments of stool or opaque                            liquid). The total BBPS score equals 8. The quality                            of the bowel preparation was good. Scope In: 9:06:39 AM Scope Out: 9:27:12 AM Scope Withdrawal Time: 0 hours 12 minutes 11 seconds  Total Procedure Duration: 0 hours 20 minutes 33 seconds  Findings:      Hemorrhoids were found on perianal exam.      Non-bleeding internal hemorrhoids were found during endoscopy.      Two sessile polyps were found in the sigmoid colon. The polyps were 2 mm       in size. These polyps were removed with a jumbo cold forceps. Resection       and retrieval were complete.      The exam was otherwise without abnormality. Impression:               - Hemorrhoids found on perianal exam.                           - Non-bleeding internal hemorrhoids.                           - Two 2 mm polyps in the sigmoid colon, removed  with a jumbo cold forceps. Resected and retrieved.                           - The examination was otherwise normal. Moderate Sedation:      Per Anesthesia Care Recommendation:           - Patient has a contact number available for                            emergencies. The signs and symptoms of potential                            delayed complications were discussed with the                            patient. Return to normal activities tomorrow.                            Written discharge instructions were provided to the                            patient.                           - Resume previous diet.                           - Continue present medications.                            - Await pathology results.                           - Repeat colonoscopy in 7-10 years for surveillance                            based on pathology results.                           - Return to GI clinic PRN. Procedure Code(s):        --- Professional ---                           [phone], Colonoscopy, flexible; with biopsy, single                            or multiple Diagnosis Code(s):        --- Professional ---                           Z12.11, Encounter for screening for malignant                            neoplasm of colon                           K64.8, Other hemorrhoids  K63.5, Polyp of colon CPT copyright 2019 American Medical Association. All rights reserved. The codes documented in this report are preliminary and upon coder review may  be revised to meet current compliance requirements. Elon Alas. Abbey Chatters, [place] Abbey Chatters, DO 12/04/2019 9:30:42 AM This report has been signed electronically. Number of Addenda: 0

## 2019-12-04 NOTE — Anesthesia Postprocedure Evaluation (Signed)
Anesthesia Post Note  Patient: Aneth Schlagel  Procedure(s) Performed: COLONOSCOPY WITH PROPOFOL (N/A ) BIOPSY  Patient location during evaluation: Endoscopy Anesthesia Type: General Level of consciousness: awake, oriented, awake and alert and patient cooperative Pain management: pain level controlled Vital Signs Assessment: post-procedure vital signs reviewed and stable Respiratory status: spontaneous breathing, respiratory function stable, patient connected to nasal cannula oxygen and nonlabored ventilation Cardiovascular status: blood pressure returned to baseline and stable Postop Assessment: no headache and no backache Anesthetic complications: no   No complications documented.   Last Vitals:  Vitals:   12/04/19 0835  BP: 116/73  Pulse: 84  Resp: 16  Temp: 37.3 C  SpO2: 99%    Last Pain:  Vitals:   12/04/19 0835  TempSrc: Oral  PainSc: 0-No pain                 Brynda Peon

## 2019-12-04 NOTE — H&P (Signed)
Primary Care Physician:  Drema Halon, FNP Primary Gastroenterologist:  Dr.   Pre-Procedure History & Physical: HPI:  Diane Cowan is a 49 y.o. female is here for a screening colonoscopy.   Past Medical History:  Diagnosis Date  . Asthma   . Family history of adverse reaction to anesthesia    pt's mother and sister have hx. of post-op nausea  . Fibromyalgia   . Migraines   . 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 PTSD (post-traumatic stress disorder)   . Sinus headache     Past Surgical History:  Procedure Laterality Date  . ABDOMINAL HYSTERECTOMY  10/30/2010   partial  . CYST EXCISION Right    wrist  . MOUTH SURGERY     wisdom teeth  . TURBINATE REDUCTION Bilateral 12/20/2015   Procedure: TURBINATE REDUCTION;  Surgeon: Newman Pies, MD;  Location: [facility];  Service: ENT;  Laterality: Bilateral;    Prior to Admission medications   Medication Sig Start Date End Date Taking? Authorizing Provider  budesonide-formoterol (SYMBICORT) 80-4.5 MCG/ACT inhaler Inhale 1 puff into the lungs 2 (two) times daily.   Yes [provider]  Cholecalciferol (VITAMIN D) 50 MCG (2000 UT) tablet Take 2,000 Units by mouth daily.   Yes [provider]  Fexofenadine HCl (ALLEGRA PO) Take 180 mg by mouth daily.    Yes [provider]  fluticasone (FLONASE) 50 MCG/ACT nasal spray Place 1 spray into both nostrils daily.   Yes [provider]  gabapentin (NEURONTIN) 300 MG capsule Take 300 mg by mouth 3 (three) times daily.   Yes [provider]  montelukast (SINGULAIR) 10 MG tablet Take 10 mg by mouth daily.    Yes [provider]  Multiple Vitamin (MULTIVITAMIN) tablet Take 1 tablet by mouth daily.   Yes [provider]  sertraline (ZOLOFT) 100 MG tablet Take 100 mg by mouth daily.   Yes [provider]  albuterol (PROVENTIL HFA;VENTOLIN HFA) 108 (90 Base) MCG/ACT inhaler Inhale into the lungs every 6 (six) hours as  needed for wheezing or shortness of breath.    [provider]    Allergies as of 11/03/2019 - Review Complete 09/22/2019  Allergen Reaction Noted  . Adhesive [tape] Other (See Comments) 12/15/2015  . Soap Other (See Comments) 12/15/2015  . Tomato Itching and Rash 12/15/2015    Family History  Problem Relation Age of Onset  . Anesthesia problems Mother        post-op nausea  . Colon polyps Mother   . Anesthesia problems Sister        post-op nausea  . Colon cancer Neg Hx     Social History   Socioeconomic History  . Marital status: Married    Spouse name: Not on file  . Number of children: Not on file  . Years of education: Not on file  . Highest education level: Not on file  Occupational History  . Not on file  Tobacco Use  . Smoking status: Former Smoker    Packs/day: 1.00    Years: 5.00    Pack years: 5.00    Types: Cigarettes    Quit date: 11/19/1993    Years since quitting: 26.0  . Smokeless tobacco: Never Used  Vaping Use  . Vaping Use: Never used  Substance and Sexual Activity  . Alcohol use: No  . Drug use: No  . Sexual activity: Yes  Other Topics Concern  . Not on file  Social History Narrative  .  Not on file   Social Determinants of Health   Financial Resource Strain:   . Difficulty of Paying Living Expenses:   Food Insecurity:   . Worried About Programme researcher, broadcasting/film/video in the Last Year:   . Barista in the Last Year:   Transportation Needs:   . Freight forwarder (Medical):   [name] Lack of Transportation (Non-Medical):   Physical Activity:   . Days of Exercise per Week:   . Minutes of Exercise per Session:   Stress:   . Feeling of Stress :   Social Connections:   . Frequency of Communication with Friends and Family:   . Frequency of Social Gatherings with Friends and Family:   . Attends Religious Services:   . Active Member of Clubs or Organizations:   . Attends Banker Meetings:   [name] Marital Status:   Intimate  Partner Violence:   . Fear of Current or Ex-Partner:   . Emotionally Abused:   [name] Physically Abused:   . Sexually Abused:     Review of Systems: See HPI, otherwise negative ROS  Impression/Plan: Diane Cowan is here for a colonoscopy to be performed for screening purposes  Risks, benefits, limitations, imponderables and alternatives regarding colonoscopy have been reviewed with the patient. Questions have been answered. All parties agreeable.

## 2019-12-04 NOTE — Transfer of Care (Signed)
Immediate Anesthesia Transfer of Care Note  Patient: Diane Cowan  Procedure(s) Performed: COLONOSCOPY WITH PROPOFOL (N/A ) BIOPSY  Patient Location: Endoscopy Unit  Anesthesia Type:General  Level of Consciousness: awake, alert , oriented and patient cooperative  Airway & Oxygen Therapy: Patient Spontanous Breathing and Patient connected to nasal cannula oxygen  Post-op Assessment: Report given to RN, Post -op Vital signs reviewed and stable and Patient moving all extremities  Post vital signs: Reviewed and stable  Last Vitals:  Vitals Value Taken Time  BP    Temp    Pulse    Resp    SpO2      Last Pain:  Vitals:   12/04/19 0835  TempSrc: Oral  PainSc: 0-No pain      Patients Stated Pain Goal: 8 (12/04/19 0835)  Complications: No complications documented.

## 2019-12-04 NOTE — Anesthesia Preprocedure Evaluation (Signed)
Anesthesia Evaluation  Patient identified by MRN, date of birth, ID band Patient awake    Reviewed: Allergy & Precautions, H&P , NPO status , Patient's Chart, lab work & pertinent test results, reviewed documented beta blocker date and time   Airway Mallampati: II  TM Distance: >3 FB Neck ROM: full    Dental no notable dental hx.    Pulmonary asthma , former smoker,    Pulmonary exam normal breath sounds clear to auscultation       Cardiovascular Exercise Tolerance: Good negative cardio ROS   Rhythm:regular Rate:Normal     Neuro/Psych  Headaches, PSYCHIATRIC DISORDERS Anxiety  Neuromuscular disease    GI/Hepatic negative GI ROS, Neg liver ROS,   Endo/Other  Morbid obesity  Renal/GU negative Renal ROS  negative genitourinary   Musculoskeletal   Abdominal   Peds  Hematology negative hematology ROS (+)   Anesthesia Other Findings   Reproductive/Obstetrics negative OB ROS                             Anesthesia Physical Anesthesia Plan  ASA: III  Anesthesia Plan: General   Post-op Pain Management:    Induction:   PONV Risk Score and Plan: Propofol infusion  Airway Management Planned:   Additional Equipment:   Intra-op Plan:   Post-operative Plan:   Informed Consent: I have reviewed the patients History and Physical, chart, labs and discussed the procedure including the risks, benefits and alternatives for the proposed anesthesia with the patient or authorized representative who has indicated his/her understanding and acceptance.     Dental Advisory Given  Plan Discussed with: CRNA  Anesthesia Plan Comments:         Anesthesia Quick Evaluation

## 2019-12-07 LAB — SURGICAL PATHOLOGY

## 2019-12-08 ENCOUNTER — Ambulatory Visit (INDEPENDENT_AMBULATORY_CARE_PROVIDER_SITE_OTHER): Payer: PRIVATE HEALTH INSURANCE | Admitting: Gastroenterology

## 2019-12-08 ENCOUNTER — Encounter: Payer: Self-pay | Admitting: Gastroenterology

## 2019-12-08 ENCOUNTER — Other Ambulatory Visit: Payer: Self-pay

## 2019-12-08 VITALS — BP 140/88 | HR 77 | Temp 97.0°F | Ht 63.0 in | Wt 213.2 lb

## 2019-12-08 DIAGNOSIS — K642 Third degree hemorrhoids: Secondary | ICD-10-CM | POA: Insufficient documentation

## 2019-12-08 NOTE — Progress Notes (Signed)
  [facility] Banding Note:   Diane Cowan is a delightful 49 year old female presenting today with symptomatic hemorrhoids, Grade 2 by description, with recent colonoscopy with non-bleeding internal hemorrhoids, two 2 mm hyperplastic polyps in sigmoid colon, s/p removal. She has had chronic issues with low-volume hematochezia, discomfort, itching. Some prolonged toilet time. No fiber currently. No significant constipation or diarrhea.   The patient presents with symptomatic grade 2 hemorrhoids, unresponsive to maximal medical therapy, requesting rubber band ligation of her hemorrhoidal disease. All risks, benefits, and alternative forms of therapy were described and informed consent was obtained.  The decision was made to band the left lateral internal hemorrhoid, and the [facility] O'Regan System was used to perform band ligation without complication. Digital anorectal examination was then performed to assure proper positioning of the band, and to adjust the banded tissue as required. The patient was discharged home without pain or other issues. Dietary and behavioral recommendations were given, along with follow-up instructions. The patient will return in 2-3 weeks for followup and possible additional banding as required.  No complications were encountered and the patient tolerated the procedure well.  Gelene Mink, PhD, ANP-BC [facility]

## 2019-12-08 NOTE — Patient Instructions (Signed)
I recommend adding Benefiber 2 teaspoons daily, increasing as tolerated if needed.  Limit toilet time to 2-3 minutes. Avoid straining and constipation.  We will see you back in several weeks for additional banding!   It was a pleasure to see you today. I want to create trusting relationships with patients to provide genuine, compassionate, and quality care. I value your feedback. If you receive a survey regarding your visit,  I greatly appreciate you taking time to fill this out.   Gelene Mink, PhD, ANP-BC [facility]

## 2020-02-04 ENCOUNTER — Encounter: Payer: PRIVATE HEALTH INSURANCE | Admitting: Gastroenterology

## 2020-03-25 ENCOUNTER — Encounter: Payer: PRIVATE HEALTH INSURANCE | Admitting: Gastroenterology

## 2020-05-11 ENCOUNTER — Ambulatory Visit: Payer: PRIVATE HEALTH INSURANCE | Admitting: Gastroenterology

## 2020-06-17 ENCOUNTER — Encounter: Payer: Self-pay | Admitting: Gastroenterology

## 2020-06-17 ENCOUNTER — Ambulatory Visit (INDEPENDENT_AMBULATORY_CARE_PROVIDER_SITE_OTHER): Payer: PRIVATE HEALTH INSURANCE | Admitting: Gastroenterology

## 2020-06-17 ENCOUNTER — Other Ambulatory Visit: Payer: Self-pay

## 2020-06-17 VITALS — BP 133/90 | HR 75 | Temp 97.7°F | Ht 63.0 in | Wt 217.2 lb

## 2020-06-17 DIAGNOSIS — K642 Third degree hemorrhoids: Secondary | ICD-10-CM

## 2020-06-17 NOTE — Progress Notes (Signed)
[facility] Banding Note:   Diane Cowan is a delightful 50 year old female presenting today with symptomatic hemorrhoids, Grade 2-3, with recent colonoscopy with non-bleeding internal hemorrhoids, two 2 mm hyperplastic polyps in sigmoid colon, s/p removal. She has had left lateral banding in Aug 2021. Here for follow-up for repeat banding. Still with intermittent itching, prolapse, and bleeding.     The patient presents with symptomatic grade 3 hemorrhoids, unresponsive to maximal medical therapy, requesting rubber band ligation of his/her hemorrhoidal disease. All risks, benefits, and alternative forms of therapy were described and informed consent was obtained.   The decision was made to band the right posterior internal hemorrhoid; however, the device was not able to capture tissue. I attempted right anterior, and again the device did not capture tissue and on further inspection was not appropriately causing a vacuum. Device was discarded and new device opened. I then successfully banded the right anterior without complication.Digital anorectal examination was then performed to assure proper positioning of the band, and to adjust the banded tissue as required. The patient was discharged home without pain or other issues. Dietary and behavioral recommendations were given and (if necessary prescriptions were given), along with follow-up instructions. The patient will return in 2-3 weeks for followup and possible additional banding as required. Will focus on right posterior at next visit.   No complications were encountered and the patient tolerated the procedure well.  Gelene Mink, PhD, ANP-BC [facility]

## 2020-06-17 NOTE — Patient Instructions (Signed)
We will see you in a few weeks!  Continue to avoid straining and constipation. Limit toilet time to 2-3 minutes.  Have a great weekend!  I enjoyed seeing you again today! As you know, I value our relationship and want to provide genuine, compassionate, and quality care. I welcome your feedback. If you receive a survey regarding your visit,  I greatly appreciate you taking time to fill this out. See you next time!  Gelene Mink, PhD, ANP-BC [facility]

## 2020-08-03 ENCOUNTER — Ambulatory Visit (INDEPENDENT_AMBULATORY_CARE_PROVIDER_SITE_OTHER): Payer: PRIVATE HEALTH INSURANCE | Admitting: Gastroenterology

## 2020-08-03 ENCOUNTER — Encounter: Payer: Self-pay | Admitting: Gastroenterology

## 2020-08-03 ENCOUNTER — Other Ambulatory Visit: Payer: Self-pay

## 2020-08-03 VITALS — BP 137/95 | HR 76 | Temp 97.1°F | Ht 63.0 in | Wt 215.8 lb

## 2020-08-03 DIAGNOSIS — K648 Other hemorrhoids: Secondary | ICD-10-CM | POA: Diagnosis not present

## 2020-08-03 NOTE — Progress Notes (Signed)
[facility] Banding Note:    Diane Cowan is a delightful 50 year old female presenting today with symptomatic hemorrhoids, Grade 2-3, with recent colonoscopy with non-bleeding internal hemorrhoids, two 2 mm hyperplastic polyps in sigmoid colon, s/p removal. She has had left lateral banding in Aug 2021, right anterior in Feb 2022, and here for follow-up for repeat banding. Clinically, she has noted improvement overall since starting this process. Lessened bleeding noted. Still with itching, prolapsing.   The patient presents with symptomatic grade 3 hemorrhoids, unresponsive to maximal medical therapy, requesting rubber band ligation of her hemorrhoidal disease. All risks, benefits, and alternative forms of therapy were described and informed consent was obtained.  The decision was made to band the right posterior internal hemorrhoid, and the [facility] System was used to perform band ligation without complication. Digital anorectal examination was then performed to assure proper positioning of the band, and to adjust the banded tissue as required. The patient was discharged home without pain or other issues. Dietary and behavioral recommendations were given and (if necessary prescriptions were given), along with follow-up instructions. We will make a follow-up appointment in case neutral banding is needed.   No complications were encountered and the patient tolerated the procedure well.  Gelene Mink, PhD, ANP-BC [facility]

## 2020-08-03 NOTE — Patient Instructions (Signed)
We are making an appointment just in case you need an additional banding!  If you don't need it, you can cancel it.  Have a great Easter!  I enjoyed seeing you again today! As you know, I value our relationship and want to provide genuine, compassionate, and quality care. I welcome your feedback. If you receive a survey regarding your visit,  I greatly appreciate you taking time to fill this out. See you next time!  Gelene Mink, PhD, ANP-BC [facility]

## 2020-10-11 ENCOUNTER — Encounter: Payer: PRIVATE HEALTH INSURANCE | Admitting: Gastroenterology
